# Patient Record
Sex: Male | Born: 1995 | Race: Black or African American | Hispanic: No | Marital: Single | Smoking: Never smoker
Health system: Southern US, Community
[De-identification: ages and names within clinical notes are randomized; demographics above are authoritative.]

## PROBLEM LIST (undated history)

## (undated) DIAGNOSIS — I10 Essential (primary) hypertension: Secondary | ICD-10-CM

## (undated) HISTORY — PX: TONSILLECTOMY: SUR1361

---

## 2021-01-20 ENCOUNTER — Emergency Department (HOSPITAL_COMMUNITY)
Admission: EM | Admit: 2021-01-20 | Discharge: 2021-01-20 | Disposition: A | Discharge status: Home / Self Care | Payer: Self-pay | Attending: Emergency Medicine | Admitting: Emergency Medicine

## 2021-01-20 ENCOUNTER — Encounter (HOSPITAL_COMMUNITY): Payer: Self-pay

## 2021-01-20 ENCOUNTER — Other Ambulatory Visit: Payer: Self-pay

## 2021-01-20 DIAGNOSIS — L910 Hypertrophic scar: Secondary | ICD-10-CM | POA: Insufficient documentation

## 2021-01-20 DIAGNOSIS — L089 Local infection of the skin and subcutaneous tissue, unspecified: Secondary | ICD-10-CM | POA: Insufficient documentation

## 2021-01-20 MED ORDER — DOXYCYCLINE HYCLATE 100 MG PO CAPS: 100.0000 mg | ORAL_CAPSULE | Freq: Two times a day (BID) | ORAL | 0 refills | Status: DC

## 2021-01-20 NOTE — ED Notes (Signed)
An After Visit Summary was printed and given to the patient. Discharge instructions given and no further questions at this time.  

## 2021-01-20 NOTE — Discharge Instructions (Addendum)
Call to set up an appointment with general surgery. In the mean time, take doyxcycline twice daily x 10 days for the infection. If you experience fever or if it worsens please return to the ED

## 2021-01-20 NOTE — ED Triage Notes (Signed)
Pt arrived via POV, states area on chest he was told was a keloid started draining this morning. Denies any fevers/chills. States this has been present for a year, has not had issues since.

## 2021-01-20 NOTE — ED Provider Notes (Signed)
  [facility]-EMERGENCY DEPT Provider Note   CSN: [id number] Arrival date & time: 01/20/21  1053     History No chief complaint on file.   Dmari Schubring is a 25 y.o. male.  HPI   Patient presents with keloid draining. Has had keloid middle of chest for one year, but this morning he noticed pus/bloody drainage. It is painful to touch. Doesn't remember hitting it or breaking it. No fevers,  Nausea, diarrhea chills.   History reviewed. No pertinent past medical history.  There are no problems to display for this patient.   Past Surgical History:  Procedure Laterality Date  . TONSILLECTOMY         History reviewed. No pertinent family history.  Social History   Tobacco Use  . Smoking status: Never Smoker  . Smokeless tobacco: Never Used    Home Medications Prior to Admission medications   Not on File    Allergies    Penicillins  Review of Systems   Review of Systems  Constitutional: Negative for chills and fever.  Skin:       Keloid draining    Physical Exam Updated Vital Signs BP (!) 176/96 (BP Location: Right Arm)   Pulse 92   Temp 98.6 F (37 C) (Oral)   Resp 18   SpO2 100%   Physical Exam Vitals and nursing note reviewed. Exam conducted with a chaperone present.  Constitutional:      General: He is not in acute distress.    Appearance: Normal appearance.  HENT:     Head: Normocephalic and atraumatic.  Eyes:     General: No scleral icterus.    Extraocular Movements: Extraocular movements intact.     Pupils: Pupils are equal, round, and reactive to light.  Skin:    Coloration: Skin is not jaundiced.     Comments: See picture. Mild pussy fluids expressed with pressure. No surrounding erythema.   Neurological:     Mental Status: He is alert. Mental status is at baseline.     Coordination: Coordination normal.       ED Results / Procedures / Treatments   Labs (all labs ordered are listed, but only abnormal results  are displayed) Labs Reviewed - No data to display  EKG None  Radiology No results found.  Procedures Procedures   Medications Ordered in ED Medications - No data to display  ED Course  I have reviewed the triage vital signs and the nursing notes.  Pertinent labs & imaging results that were available during my care of the patient were reviewed by me and considered in my medical decision making (see chart for details).    MDM Rules/Calculators/A&P                          Patient presents with keloid. Vitals stable, no systemic symptoms. Doubt cellulitis. It appears there is an infection possible underneath the keloid. Will start on doxycycline and give referral to general surgery.   Discussed HPI, physical exam and plan of care for this patient with attending Rolan Bucco. The attending physician evaluated this patient as part of a shared visit and agrees with plan of care.  Final Clinical Impression(s) / ED Diagnoses Final diagnoses:  None    Rx / DC Orders ED Discharge Orders    None       Theron Arista, PA-C 01/20/21 1258    Rolan Bucco, MD 01/20/21 1315

## 2021-08-22 ENCOUNTER — Telehealth: Payer: Medicaid Other | Admitting: Emergency Medicine

## 2021-08-22 NOTE — Progress Notes (Signed)
The patient no-showed for appointment despite this provider sending direct link, reaching out via phone with no response and waiting for at least 10 minutes from appointment time for patient to join. They will be marked as a NS for this appointment/time.   Jair Lindblad, PA-C    

## 2021-08-23 ENCOUNTER — Emergency Department (HOSPITAL_COMMUNITY)
Admission: EM | Admit: 2021-08-23 | Discharge: 2021-08-23 | Disposition: A | Discharge status: Home / Self Care | Payer: Managed Care, Other (non HMO) | Attending: Emergency Medicine | Admitting: Emergency Medicine

## 2021-08-23 ENCOUNTER — Other Ambulatory Visit: Payer: Self-pay

## 2021-08-23 ENCOUNTER — Encounter (HOSPITAL_COMMUNITY): Payer: Self-pay

## 2021-08-23 DIAGNOSIS — Z20822 Contact with and (suspected) exposure to covid-19: Secondary | ICD-10-CM | POA: Insufficient documentation

## 2021-08-23 DIAGNOSIS — R519 Headache, unspecified: Secondary | ICD-10-CM

## 2021-08-23 DIAGNOSIS — G43909 Migraine, unspecified, not intractable, without status migrainosus: Secondary | ICD-10-CM | POA: Diagnosis not present

## 2021-08-23 DIAGNOSIS — M791 Myalgia, unspecified site: Secondary | ICD-10-CM | POA: Diagnosis not present

## 2021-08-23 LAB — RESP PANEL BY RT-PCR (FLU A&B, COVID) ARPGX2
Influenza A by PCR: NEGATIVE
Influenza B by PCR: NEGATIVE
SARS Coronavirus 2 by RT PCR: NEGATIVE

## 2021-08-23 MED ORDER — KETOROLAC TROMETHAMINE 30 MG/ML IJ SOLN
30.0000 mg | Freq: Once | INTRAMUSCULAR | Status: AC
  Administered 2021-08-23: 30 mg via INTRAVENOUS
  Filled 2021-08-23: qty 1

## 2021-08-23 MED ORDER — DIPHENHYDRAMINE HCL 50 MG/ML IJ SOLN
50.0000 mg | Freq: Once | INTRAMUSCULAR | Status: AC
  Administered 2021-08-23: 50 mg via INTRAVENOUS
  Filled 2021-08-23: qty 1

## 2021-08-23 MED ORDER — PROCHLORPERAZINE MALEATE 10 MG PO TABS: 10.0000 mg | ORAL_TABLET | Freq: Two times a day (BID) | ORAL | 0 refills | Status: AC | PRN

## 2021-08-23 MED ORDER — PROCHLORPERAZINE EDISYLATE 10 MG/2ML IJ SOLN
10.0000 mg | Freq: Once | INTRAMUSCULAR | Status: AC
  Administered 2021-08-23: 10 mg via INTRAVENOUS
  Filled 2021-08-23: qty 2

## 2021-08-23 MED ORDER — SODIUM CHLORIDE 0.9 % IV BOLUS
1000.0000 mL | Freq: Once | INTRAVENOUS | Status: AC
  Administered 2021-08-23: 1000 mL via INTRAVENOUS

## 2021-08-23 NOTE — ED Triage Notes (Signed)
Pt reports head pain and mild nausea x1 week. Pt reports hx of migraines. Denies confusion and blurred vision. Denies injury to head.

## 2021-08-23 NOTE — Discharge Instructions (Signed)
Your history, exam, work-up today are consistent with evolution your chronic migraine type headaches.  Your neuro exam was completely reassuring and your headache resolved after the headache cocktail.  Please rest and stay hydrated and use the Compazine to help with any further nausea or headache.  We discussed using Benadryl to help if you get agitated.  Please follow-up with your primary doctor as well as follow-up with neurology for further headache planning.  Your COVID and flu test were negative.  Please rest.

## 2021-08-23 NOTE — ED Provider Notes (Signed)
[facility] DEPT Provider Note   CSN: [id number] Arrival date & time: 08/23/21  0844     History  Chief Complaint  Patient presents with   Migraine    Buddie Sulcer is a 26 y.o. male.  The history is provided by the patient and medical records. No language interpreter was used.  Migraine This is a recurrent problem. The current episode started more than 2 days ago. The problem occurs constantly. The problem has not changed since onset.Associated symptoms include headaches. Pertinent negatives include no chest pain, no abdominal pain and no shortness of breath. Nothing (bright light and loud noises) aggravates the symptoms. Nothing relieves the symptoms. Treatments tried: excedrin. The treatment provided no relief.      Home Medications Prior to Admission medications   Not on File      Allergies    Penicillins    Review of Systems   Review of Systems  Constitutional:  Negative for chills, diaphoresis, fatigue and fever.  HENT:  Negative for congestion.   Eyes:  Positive for photophobia. Negative for visual disturbance.  Respiratory:  Negative for cough, chest tightness, shortness of breath and wheezing.   Cardiovascular:  Negative for chest pain.  Gastrointestinal:  Positive for nausea. Negative for abdominal pain, constipation, diarrhea and vomiting.  Genitourinary:  Negative for flank pain.  Musculoskeletal:  Positive for myalgias. Negative for back pain, neck pain and neck stiffness.  Skin:  Negative for rash and wound.  Neurological:  Positive for headaches. Negative for seizures, syncope, speech difficulty, weakness, light-headedness and numbness.  Psychiatric/Behavioral:  Negative for agitation and confusion.   All other systems reviewed and are negative.  Physical Exam Updated Vital Signs BP 140/81    Pulse 69    Temp 98.7 F (37.1 C) (Oral)    Resp 16    SpO2 98%  Physical Exam Vitals and nursing note reviewed.  Constitutional:       General: He is not in acute distress.    Appearance: He is well-developed. He is not ill-appearing, toxic-appearing or diaphoretic.  HENT:     Head: Normocephalic and atraumatic.     Nose: Nose normal. No congestion or rhinorrhea.     Mouth/Throat:     Mouth: Mucous membranes are moist.     Pharynx: No oropharyngeal exudate or posterior oropharyngeal erythema.  Eyes:     General: No scleral icterus.    Extraocular Movements: Extraocular movements intact.     Conjunctiva/sclera: Conjunctivae normal.     Pupils: Pupils are equal, round, and reactive to light.  Neck:     Vascular: No carotid bruit.  Cardiovascular:     Rate and Rhythm: Normal rate and regular rhythm.     Pulses: Normal pulses.     Heart sounds: No murmur heard. Pulmonary:     Effort: Pulmonary effort is normal. No respiratory distress.     Breath sounds: Normal breath sounds.  Abdominal:     Palpations: Abdomen is soft.     Tenderness: There is no abdominal tenderness. There is no guarding or rebound.  Musculoskeletal:        General: No swelling or tenderness.     Cervical back: Neck supple. No rigidity or tenderness.     Right lower leg: No edema.     Left lower leg: No edema.  Skin:    General: Skin is warm and dry.     Capillary Refill: Capillary refill takes less than 2 seconds.  Findings: No erythema or rash.  Neurological:     General: No focal deficit present.     Mental Status: He is alert and oriented to person, place, and time. Mental status is at baseline.     Cranial Nerves: No cranial nerve deficit.     Sensory: No sensory deficit.     Motor: No weakness.     Coordination: Coordination normal.  Psychiatric:        Mood and Affect: Mood normal.    ED Results / Procedures / Treatments   Labs (all labs ordered are listed, but only abnormal results are displayed) Labs Reviewed  RESP PANEL BY RT-PCR (FLU A&B, COVID) ARPGX2    EKG None  Radiology No results  found.  Procedures Procedures    Medications Ordered in ED Medications  sodium chloride 0.9 % bolus 1,000 mL (has no administration in time range)  prochlorperazine (COMPAZINE) injection 10 mg (has no administration in time range)  diphenhydrAMINE (BENADRYL) injection 50 mg (has no administration in time range)  ketorolac (TORADOL) 30 MG/ML injection 30 mg (has no administration in time range)    ED Course/ Medical Decision Making/ A&P                           Medical Decision Making  Tyreke Bassinger is a 26 y.o. male with a chronic past medical history significant for migraines who presents with headache and nausea for 1 week.  According to patient, he had a headache that started about a week ago and although improved slightly it has been persistent.  He describes some photophobia and phonophobia.  He denies any fevers, chills, congestion, or cough.  Denies any sick contacts.  Denies any constipation, diarrhea, urinary changes, and does not report any incontinence.  Denies any changes in his arms or legs with no numbness, weakness, or neurologic complaints.  He reports photophobia but does not report any vision changes.  No reported head injury.  He reports he gets headaches frequently usually every few weeks.  On exam, lungs are clear and chest is nontender.  Abdomen is nontender.  No focal neurologic deficits with normal sensation and strength in extremities, normal finger-nose-finger testing, and pupils are symmetric and reactive with normal extraocular movements.  Symmetric smile and clear speech.  Neck nontender, back nontender.  Rest of exam unremarkable.  Had a shared decision made conversation with patient and based on his reassuring exam, I suspect this is a recurrence of a migrainous type headache that has evolved slightly.  Patient agrees for headache cocktail and reassessment.  Due to his report of some headache and also some mild myalgias diffusely, will add on a flu/COVID swab  given the ongoing pandemic and amount in the community.  Based on his lack of neck pain, fevers, low suspicion for a meningitic type picture and we agree to hold on lumbar puncture.  Based on his reassuring neuro exam, I agree with patient to hold on any imaging of the head at this time.  Anticipate reassessment after headache cocktail.  12:26 PM Patient reports headache has resolved.  He is feeling much better.  He would like to go home and rest.  We still agree on the previous plan holding on further work-up.  Patient will be given prescription for Compazine which we discussed allergies Benadryl if needed.  Patient will follow with PCP and also given number for neurology for headache follow-up.  Patient is return precautions and  follow instructions was discharged in good condition.  COVID and flu were negative as well.        Final Clinical Impression(s) / ED Diagnoses Final diagnoses:  Bad headache  Myalgia    Rx / DC Orders ED Discharge Orders          Ordered    prochlorperazine (COMPAZINE) 10 MG tablet  2 times daily PRN        08/23/21 1227            Clinical Impression: 1. Bad headache   2. Myalgia     Disposition: Discharge  Condition: Good  I have discussed the results, Dx and Tx plan with the pt(& family if present). He/she/they expressed understanding and agree(s) with the plan. Discharge instructions discussed at great length. Strict return precautions discussed and pt &/or family have verbalized understanding of the instructions. No further questions at time of discharge.    New Prescriptions   PROCHLORPERAZINE (COMPAZINE) 10 MG TABLET    Take 1 tablet (10 mg total) by mouth 2 (two) times daily as needed for nausea or vomiting.    Follow Up: [facility] [address] [place] [phone]       Jahniya Duzan, Gwenyth Allegra, MD 08/23/21 1228

## 2021-12-20 ENCOUNTER — Emergency Department (HOSPITAL_BASED_OUTPATIENT_CLINIC_OR_DEPARTMENT_OTHER): Payer: Managed Care, Other (non HMO)

## 2021-12-20 ENCOUNTER — Emergency Department (HOSPITAL_BASED_OUTPATIENT_CLINIC_OR_DEPARTMENT_OTHER)
Admission: EM | Admit: 2021-12-20 | Discharge: 2021-12-20 | Discharge status: Against Medical Advice | Payer: Managed Care, Other (non HMO) | Attending: Emergency Medicine | Admitting: Emergency Medicine

## 2021-12-20 ENCOUNTER — Other Ambulatory Visit: Payer: Self-pay

## 2021-12-20 ENCOUNTER — Encounter (HOSPITAL_BASED_OUTPATIENT_CLINIC_OR_DEPARTMENT_OTHER): Payer: Self-pay

## 2021-12-20 DIAGNOSIS — Z5321 Procedure and treatment not carried out due to patient leaving prior to being seen by health care provider: Secondary | ICD-10-CM | POA: Insufficient documentation

## 2021-12-20 DIAGNOSIS — M79605 Pain in left leg: Secondary | ICD-10-CM | POA: Insufficient documentation

## 2021-12-20 DIAGNOSIS — M79651 Pain in right thigh: Secondary | ICD-10-CM | POA: Diagnosis not present

## 2021-12-20 DIAGNOSIS — M25561 Pain in right knee: Secondary | ICD-10-CM | POA: Diagnosis not present

## 2021-12-20 HISTORY — DX: Essential (primary) hypertension: I10

## 2021-12-20 NOTE — ED Triage Notes (Addendum)
Pt reports sitting while at work and left leg began feeling tight and pain began. Points to pain right mid lateral thigh, knee and just below knee  Pain with movement and when ambulating.  ?

## 2021-12-27 ENCOUNTER — Emergency Department (HOSPITAL_BASED_OUTPATIENT_CLINIC_OR_DEPARTMENT_OTHER): Payer: Managed Care, Other (non HMO)

## 2021-12-27 ENCOUNTER — Encounter (HOSPITAL_BASED_OUTPATIENT_CLINIC_OR_DEPARTMENT_OTHER): Payer: Self-pay

## 2021-12-27 ENCOUNTER — Other Ambulatory Visit: Payer: Self-pay

## 2021-12-27 ENCOUNTER — Emergency Department (HOSPITAL_BASED_OUTPATIENT_CLINIC_OR_DEPARTMENT_OTHER)
Admission: EM | Admit: 2021-12-27 | Discharge: 2021-12-27 | Disposition: A | Discharge status: Home / Self Care | Payer: Managed Care, Other (non HMO) | Attending: Emergency Medicine | Admitting: Emergency Medicine

## 2021-12-27 DIAGNOSIS — I10 Essential (primary) hypertension: Secondary | ICD-10-CM | POA: Insufficient documentation

## 2021-12-27 DIAGNOSIS — R202 Paresthesia of skin: Secondary | ICD-10-CM

## 2021-12-27 DIAGNOSIS — R519 Headache, unspecified: Secondary | ICD-10-CM | POA: Diagnosis present

## 2021-12-27 LAB — COMPREHENSIVE METABOLIC PANEL
ALT: 22 U/L (ref 0–44)
AST: 15 U/L (ref 15–41)
Albumin: 4 g/dL (ref 3.5–5.0)
Alkaline Phosphatase: 75 U/L (ref 38–126)
Anion gap: 7 (ref 5–15)
BUN: 9 mg/dL (ref 6–20)
CO2: 25 mmol/L (ref 22–32)
Calcium: 8.8 mg/dL — ABNORMAL LOW (ref 8.9–10.3)
Chloride: 103 mmol/L (ref 98–111)
Creatinine, Ser: 0.87 mg/dL (ref 0.61–1.24)
GFR, Estimated: >60 mL/min (ref >60)
Glucose, Bld: 101 mg/dL — ABNORMAL HIGH (ref 70–99)
Potassium: 3.8 mmol/L (ref 3.5–5.1)
Sodium: 135 mmol/L (ref 135–145)
Total Bilirubin: 0.4 mg/dL (ref 0.3–1.2)
Total Protein: 7.9 g/dL (ref 6.5–8.1)

## 2021-12-27 LAB — CBC WITH DIFFERENTIAL/PLATELET
Abs Immature Granulocytes: 0.02 10*3/uL (ref 0.00–0.07)
Basophils Absolute: 0 10*3/uL (ref 0.0–0.1)
Basophils Relative: 0 %
Eosinophils Absolute: 0 10*3/uL (ref 0.0–0.5)
Eosinophils Relative: 0 %
HCT: 42.1 % (ref 39.0–52.0)
Hemoglobin: 13.7 g/dL (ref 13.0–17.0)
Immature Granulocytes: 0 %
Lymphocytes Relative: 27 %
Lymphs Abs: 2.7 10*3/uL (ref 0.7–4.0)
MCH: 27.3 pg (ref 26.0–34.0)
MCHC: 32.5 g/dL (ref 30.0–36.0)
MCV: 84 fL (ref 80.0–100.0)
Monocytes Absolute: 0.6 10*3/uL (ref 0.1–1.0)
Monocytes Relative: 6 %
Neutro Abs: 6.5 10*3/uL (ref 1.7–7.7)
Neutrophils Relative %: 67 %
Platelets: 346 10*3/uL (ref 150–400)
RBC: 5.01 MIL/uL (ref 4.22–5.81)
RDW: 13.4 % (ref 11.5–15.5)
WBC: 9.8 10*3/uL (ref 4.0–10.5)
nRBC: 0 % (ref 0.0–0.2)

## 2021-12-27 NOTE — ED Provider Notes (Addendum)
?[facility] EMERGENCY DEPARTMENT ?Provider Note ? ? ?CSN: [id number] ?Arrival date & time: 12/27/21  2025 ? ?  ? ?History ? ?Chief Complaint  ?Patient presents with  ? Headache  ? Tingling  ? ? ?Paul Jensen is a 26 y.o. male. ? ?Patient with complaint of tingling to bilateral feet and hands.  Ongoing for 1 week.  Denies any weakness any difficulty with speech blurred vision any true numbness.  Just tingling.  Has a history of hypertension is taken amlodipine 5 mg for that.  Patient seen with same complaint April 15 at [facility] emergency department.  And stated at that time that the symptoms have been related to low potassium in the past.  Medical records show in the past that he had a potassium of 2.4 but on April 15 of potassium was 4.1.  Patient states he has not had a CT of the head for this.  This has occurred in the past.  Most recently April 15 and the time prior to that.  Patient is not a smoker.  Past medical history just significant for hypertension.  In addition patient denies any headache.  Patient states she just feels strange in the head. ? ? ?  ? ?Home Medications ?Prior to Admission medications   ?Medication Sig Start Date End Date Taking? Authorizing Provider  ?prochlorperazine (COMPAZINE) 10 MG tablet Take 1 tablet (10 mg total) by mouth 2 (two) times daily as needed for nausea or vomiting. 08/23/21   Tegeler, Canary Brim, MD  ?   ? ?Allergies    ?Penicillins   ? ?Review of Systems   ?Review of Systems  ?Constitutional:  Negative for chills and fever.  ?HENT:  Negative for ear pain and sore throat.   ?Eyes:  Negative for pain and visual disturbance.  ?Respiratory:  Negative for cough and shortness of breath.   ?Cardiovascular:  Negative for chest pain and palpitations.  ?Gastrointestinal:  Negative for abdominal pain and vomiting.  ?Genitourinary:  Negative for dysuria and hematuria.  ?Musculoskeletal:  Negative for arthralgias and back pain.  ?Skin:  Negative for color change  and rash.  ?Neurological:  Positive for light-headedness. Negative for dizziness, seizures, syncope, facial asymmetry, speech difficulty, weakness, numbness and headaches.  ?Hematological:  Does not bruise/bleed easily.  ?All other systems reviewed and are negative. ? ?Physical Exam ?Updated Vital Signs ?BP (!) 157/100 (BP Location: Right Arm)   Pulse 95   Temp 98.2 ?F (36.8 ?C) (Oral)   Resp 18   Ht 1.753 m (5\' 9" )   Wt 124.7 kg   SpO2 100%   BMI 40.61 kg/m?  ?Physical Exam ?Vitals and nursing note reviewed.  ?Constitutional:   ?   General: He is not in acute distress. ?   Appearance: He is well-developed. He is not ill-appearing, toxic-appearing or diaphoretic.  ?HENT:  ?   Head: Normocephalic and atraumatic.  ?Eyes:  ?   General: No visual field deficit. ?   Extraocular Movements: Extraocular movements intact.  ?   Conjunctiva/sclera: Conjunctivae normal.  ?   Pupils: Pupils are equal, round, and reactive to light.  ?Cardiovascular:  ?   Rate and Rhythm: Normal rate and regular rhythm.  ?   Heart sounds: No murmur heard. ?Pulmonary:  ?   Effort: Pulmonary effort is normal. No respiratory distress.  ?   Breath sounds: Normal breath sounds.  ?Abdominal:  ?   Palpations: Abdomen is soft.  ?   Tenderness: There is no abdominal tenderness.  ?  Musculoskeletal:     ?   General: No swelling. Normal range of motion.  ?   Cervical back: Normal range of motion and neck supple.  ?Skin: ?   General: Skin is warm and dry.  ?   Capillary Refill: Capillary refill takes less than 2 seconds.  ?Neurological:  ?   Mental Status: He is alert and oriented to person, place, and time.  ?   GCS: GCS eye subscore is 4. GCS verbal subscore is 5. GCS motor subscore is 6.  ?   Cranial Nerves: No cranial nerve deficit, dysarthria or facial asymmetry.  ?   Sensory: No sensory deficit.  ?   Motor: No weakness.  ?   Coordination: Coordination normal.  ?   Deep Tendon Reflexes: Reflexes normal.  ?Psychiatric:     ?   Mood and Affect: Mood  normal.     ?   Speech: Speech normal.     ?   Behavior: Behavior normal.  ? ? ?ED Results / Procedures / Treatments   ?Labs ?(all labs ordered are listed, but only abnormal results are displayed) ?Labs Reviewed  ?CBC WITH DIFFERENTIAL/PLATELET  ?COMPREHENSIVE METABOLIC PANEL  ? ? ?EKG ?None ? ?Radiology ?No results found. ? ?Procedures ?Procedures  ? ? ?Medications Ordered in ED ?Medications - No data to display ? ?ED Course/ Medical Decision Making/ A&P ?  ?                        ?Medical Decision Making ?Amount and/or Complexity of Data Reviewed ?Labs: ordered. ?Radiology: ordered. ? ? ?Patient is neuro exam without any deficits.  Suspect this is similar to his previous presentations with certainly will make sure he does not have hypokalemia.  Based on record review patient has not had CT head.  So we will get that the main concern would be possibly MS.  Certainly patient's symptoms have been present for a week would make it likely that any kind of deficit from a CVA which I think is unlikely would show up.  Do not feel that patient needs MRI.  Can have outpatient follow-up with neurology. ? ?We will get basic labs and the CT head.  Patient's blood pressure upon arrival was 157/100 but repeat was 143/98.  So is showing some signs of some elevated blood pressure states he is taking his amlodipine 5 mg as directed.  And he does have a primary care doctor. ? ?Head CT without any acute findings.  CBC normal.  Complete metabolic panel normal other than glucose 101.  Liver function tests normal.  Potassium normal at 3.8. ? ?Patient will need close follow-up for blood pressure.  And also will refer to neurology. ? ?Final Clinical Impression(s) / ED Diagnoses ?Final diagnoses:  ?Primary hypertension  ?Paresthesia of both hands  ?Tingling of both feet  ? ? ?Rx / DC Orders ?ED Discharge Orders   ? ? None  ? ?  ? ? ?  ?Vanetta Mulders, MD ?12/27/21 [phone] ? ?  ?Vanetta Mulders, MD ?12/27/21 1031 ? ?

## 2021-12-27 NOTE — ED Triage Notes (Signed)
Pt C/o of headache and bilateral tingling in arms and legs x 1week. Denies numbness, or blurred vision. Hx of HTN, states did not take his BP meds this morning.  ?

## 2021-12-27 NOTE — Discharge Instructions (Addendum)
Follow-up with your primary care doctor for blood pressure rechecks.  Follow-up with neurology make an appointment for the tingling in the hands and arms.  Today's work-up without any acute findings.  Potassium was 3.8.  Work note provided.  ? ?Continue your blood pressure medicine. ?

## 2022-07-13 ENCOUNTER — Encounter (HOSPITAL_BASED_OUTPATIENT_CLINIC_OR_DEPARTMENT_OTHER): Payer: Self-pay | Admitting: Emergency Medicine

## 2022-07-13 ENCOUNTER — Emergency Department (HOSPITAL_BASED_OUTPATIENT_CLINIC_OR_DEPARTMENT_OTHER)
Admission: EM | Admit: 2022-07-13 | Discharge: 2022-07-13 | Disposition: A | Discharge status: Home / Self Care | Payer: BC Managed Care – PPO | Attending: Emergency Medicine | Admitting: Emergency Medicine

## 2022-07-13 ENCOUNTER — Emergency Department (HOSPITAL_BASED_OUTPATIENT_CLINIC_OR_DEPARTMENT_OTHER): Payer: BC Managed Care – PPO

## 2022-07-13 ENCOUNTER — Other Ambulatory Visit: Payer: Self-pay

## 2022-07-13 DIAGNOSIS — R0789 Other chest pain: Secondary | ICD-10-CM | POA: Insufficient documentation

## 2022-07-13 DIAGNOSIS — I10 Essential (primary) hypertension: Secondary | ICD-10-CM | POA: Insufficient documentation

## 2022-07-13 DIAGNOSIS — R079 Chest pain, unspecified: Secondary | ICD-10-CM

## 2022-07-13 NOTE — Discharge Instructions (Signed)
Your chest x-ray did not show pneumonia or collapsed lung.  I think based on your story its unlikely to be a heart attack.  Your EKG did not show any obvious concerning findings.  Please follow-up with your family doctor in the office.  Return to the emergency department for worsening symptoms especially upon exertion.  Take 4 over the counter ibuprofen tablets 3 times a day or 2 over-the-counter naproxen tablets twice a day for pain. Also take tylenol 1000mg (2 extra strength) four times a day.    Try pepcid or tagamet up to twice a day.  Try to avoid things that may make this worse, most commonly these are spicy foods tomato based products fatty foods chocolate and peppermint.  Alcohol and tobacco can also make this worse.  Return to the emergency department for sudden worsening pain fever or inability to eat or drink.

## 2022-07-13 NOTE — ED Triage Notes (Signed)
Left breast pain since the weekend.  No sob.  No travel.  No cold symptoms.  No injury.  Deep breath worsens pain.

## 2022-07-13 NOTE — ED Provider Notes (Signed)
[facility] EMERGENCY DEPARTMENT Provider Note   CSN: [id number] Arrival date & time: 07/13/22  7510     History  Chief Complaint  Patient presents with   Chest Pain    Leonard Feigel is a 26 y.o. male.  26 yo M with a chief complaints of left-sided chest discomfort.  Is been going on for a few days now.  Worse with deep breathing and certain positions.  Not exertional no difficulty breathing some congestion but no cough.  No fevers.  No trauma.  Patient denies history of MI, denies hyperlipidemia diabetes or smoking.  Has a hx of HTN, sister with MI in her 33's.   Patient denies history of PE or DVT denies hemoptysis denies unilateral lower extremity edema denies recent surgery immobilization hospitalization estrogen use or history of cancer.     Chest Pain      Home Medications Prior to Admission medications   Medication Sig Start Date End Date Taking? Authorizing Provider  prochlorperazine (COMPAZINE) 10 MG tablet Take 1 tablet (10 mg total) by mouth 2 (two) times daily as needed for nausea or vomiting. 08/23/21   Tegeler, Canary Brim, MD      Allergies    Penicillins    Review of Systems   Review of Systems  Cardiovascular:  Positive for chest pain.    Physical Exam Updated Vital Signs BP 119/82   Pulse 65   Temp 98.5 F (36.9 C) (Oral)   Resp 17   Ht 5\' 9"  (1.753 m)   Wt 122.5 kg   SpO2 98%   BMI 39.87 kg/m  Physical Exam Vitals and nursing note reviewed.  Constitutional:      Appearance: He is well-developed.  HENT:     Head: Normocephalic and atraumatic.  Eyes:     Pupils: Pupils are equal, round, and reactive to light.  Neck:     Vascular: No JVD.  Cardiovascular:     Rate and Rhythm: Normal rate and regular rhythm.     Heart sounds: No murmur heard.    No friction rub. No gallop.  Pulmonary:     Effort: No respiratory distress.     Breath sounds: No wheezing.  Chest:     Chest wall: Tenderness present.     Comments: Pain  with palpation about the medial clavicular line about ribs 6 and 7 reproduces discomfort.  No rash. Abdominal:     General: There is no distension.     Tenderness: There is no abdominal tenderness. There is no guarding or rebound.  Musculoskeletal:        General: Normal range of motion.     Cervical back: Normal range of motion and neck supple.  Skin:    Coloration: Skin is not pale.     Findings: No rash.  Neurological:     Mental Status: He is alert and oriented to person, place, and time.  Psychiatric:        Behavior: Behavior normal.     ED Results / Procedures / Treatments   Labs (all labs ordered are listed, but only abnormal results are displayed) Labs Reviewed - No data to display  EKG EKG Interpretation  Date/Time:  Wednesday July 13 2022 08:21:27 EST Ventricular Rate:  74 PR Interval:  144 QRS Duration: 85 QT Interval:  345 QTC Calculation: 383 R Axis:   48 Text Interpretation: Sinus rhythm Borderline T wave abnormalities No old tracing to compare Confirmed by [id number] [phone]) on 07/13/2022 8:40:27 AM  Radiology DG Chest Port 1 View  Result Date: 07/13/2022 CLINICAL DATA:  Chest pain. EXAM: PORTABLE CHEST 1 VIEW COMPARISON:  October 16, 2021. FINDINGS: The heart size and mediastinal contours are within normal limits. Both lungs are clear. The visualized skeletal structures are unremarkable. IMPRESSION: No active disease. Electronically Signed   By: Lupita Raider M.D.   On: 07/13/2022 08:51    Procedures Procedures    Medications Ordered in ED Medications - No data to display  ED Course/ Medical Decision Making/ A&P                           Medical Decision Making Amount and/or Complexity of Data Reviewed Radiology: ordered.   26 yo M with a chief complaints of left-sided chest discomfort.  This is atypical in nature and reproduced on exam.  Has been going on for a few days now.  EKG without obvious acute ischemia.  Will obtain a chest  x-ray.  Treat as musculoskeletal.  PCP follow-up.  Chest x-ray independently interpreted by me negative for focal treated and pneumothorax.  10:41 AM:  I have discussed the diagnosis/risks/treatment options with the patient.  Evaluation and diagnostic testing in the emergency department does not suggest an emergent condition requiring admission or immediate intervention beyond what has been performed at this time.  They will follow up with PCP. We also discussed returning to the ED immediately if new or worsening sx occur. We discussed the sx which are most concerning (e.g., sudden worsening pain, fever, inability to tolerate by mouth, exertional symptoms) that necessitate immediate return. Medications administered to the patient during their visit and any new prescriptions provided to the patient are listed below.  Medications given during this visit Medications - No data to display   The patient appears reasonably screen and/or stabilized for discharge and I doubt any other medical condition or other [facility] requiring further screening, evaluation, or treatment in the ED at this time prior to discharge.          Final Clinical Impression(s) / ED Diagnoses Final diagnoses:  Nonspecific chest pain    Rx / DC Orders ED Discharge Orders     None         Melene Plan, DO 07/13/22 1041

## 2022-07-13 NOTE — ED Notes (Signed)
Discharge instructions reviewed with patient. Patient verbalizes understanding, no further questions at this time. Medications and follow up information provided. No acute distress noted at time of departure.  

## 2022-09-27 ENCOUNTER — Emergency Department (HOSPITAL_BASED_OUTPATIENT_CLINIC_OR_DEPARTMENT_OTHER)
Admission: EM | Admit: 2022-09-27 | Discharge: 2022-09-27 | Disposition: A | Discharge status: Home / Self Care | Payer: BC Managed Care – PPO | Attending: Emergency Medicine | Admitting: Emergency Medicine

## 2022-09-27 ENCOUNTER — Other Ambulatory Visit: Payer: Self-pay

## 2022-09-27 ENCOUNTER — Encounter (HOSPITAL_BASED_OUTPATIENT_CLINIC_OR_DEPARTMENT_OTHER): Payer: Self-pay

## 2022-09-27 ENCOUNTER — Emergency Department (HOSPITAL_BASED_OUTPATIENT_CLINIC_OR_DEPARTMENT_OTHER): Payer: BC Managed Care – PPO

## 2022-09-27 DIAGNOSIS — S86912A Strain of unspecified muscle(s) and tendon(s) at lower leg level, left leg, initial encounter: Secondary | ICD-10-CM | POA: Insufficient documentation

## 2022-09-27 DIAGNOSIS — X58XXXA Exposure to other specified factors, initial encounter: Secondary | ICD-10-CM | POA: Diagnosis not present

## 2022-09-27 DIAGNOSIS — T148XXA Other injury of unspecified body region, initial encounter: Secondary | ICD-10-CM

## 2022-09-27 DIAGNOSIS — M25562 Pain in left knee: Secondary | ICD-10-CM | POA: Diagnosis present

## 2022-09-27 NOTE — ED Triage Notes (Signed)
Pt c/o pain behind L knee starting last night.  Denies injury.  Pain score 8/10.  Pt reports pain is worse w/ ambulation.

## 2022-09-27 NOTE — Discharge Instructions (Signed)
Use Tylenol or Ibuprofen for symptomatic relief.  Make an appointment to follow-up with your primary care doctor.  Your blood pressure was a little bit high.  This needs to be rechecked by your primary care doctor.  Return to the emergency room if you have any worsening symptoms.

## 2022-09-27 NOTE — ED Provider Notes (Signed)
[facility] Provider Note   CSN: [id number] Arrival date & time: 09/27/22  3810     History  Chief Complaint  Patient presents with   Knee Pain    Ellwyn Ergle is a 27 y.o. male.  Patient is a 27 year old who complains of pain to his left knee.  He said it started last night and feels like a muscle ache.  It is worse with movement or ambulation.  It is on the lateral aspect of his left upper calf.  He denies any known injury.  No numbness or weakness to the leg.  No chest pain or shortness of breath.  No swelling to the leg.       Home Medications Prior to Admission medications   Medication Sig Start Date End Date Taking? Authorizing Provider  prochlorperazine (COMPAZINE) 10 MG tablet Take 1 tablet (10 mg total) by mouth 2 (two) times daily as needed for nausea or vomiting. 08/23/21   Tegeler, Gwenyth Allegra, MD      Allergies    Penicillins    Review of Systems   Review of Systems  Constitutional:  Negative for chills, diaphoresis, fatigue and fever.  HENT:  Negative for congestion, rhinorrhea and sneezing.   Eyes: Negative.   Respiratory:  Negative for cough, chest tightness and shortness of breath.   Cardiovascular:  Negative for chest pain and leg swelling.  Gastrointestinal:  Negative for abdominal pain, blood in stool, diarrhea, nausea and vomiting.  Genitourinary:  Negative for difficulty urinating, flank pain, frequency and hematuria.  Musculoskeletal:  Positive for arthralgias and myalgias. Negative for back pain.  Skin:  Negative for rash.  Neurological:  Negative for dizziness, speech difficulty, weakness, numbness and headaches.    Physical Exam Updated Vital Signs BP (!) 146/80 (BP Location: Left Arm)   Pulse 99   Temp 98.1 F (36.7 C) (Oral)   Resp 20   Ht 5\' 9"  (1.753 m)   Wt 129.3 kg   SpO2 100%   BMI 42.09 kg/m  Physical Exam Constitutional:      Appearance: He is well-developed.  HENT:      Head: Normocephalic and atraumatic.  Cardiovascular:     Rate and Rhythm: Normal rate.  Pulmonary:     Effort: Pulmonary effort is normal.  Musculoskeletal:        General: Tenderness present.     Cervical back: Normal range of motion and neck supple.     Comments: Positive tenderness to the left upper cath at the base of the knee.  In the left side and posterior aspect of the calf.  There is no specific tenderness to the knee joint although there is some pain on varus stress test.  No gross ligament instability is noted.  Pedal pulses are intact.  He has normal sensation and motor function distally.  No visualized swelling is noted to the leg.  Skin:    General: Skin is warm and dry.  Neurological:     Mental Status: He is alert and oriented to person, place, and time.     ED Results / Procedures / Treatments   Labs (all labs ordered are listed, but only abnormal results are displayed) Labs Reviewed - No data to display  EKG None  Radiology US Venous Img Lower  Left (DVT Study)  Result Date: 09/27/2022 CLINICAL DATA:  Left posterior knee pain EXAM: LEFT LOWER EXTREMITY VENOUS DOPPLER ULTRASOUND TECHNIQUE: Gray-scale sonography with compression, as well as color  and duplex ultrasound, were performed to evaluate the deep venous system(s) from the level of the common femoral vein through the popliteal and proximal calf veins. COMPARISON:  None Available. FINDINGS: VENOUS Normal compressibility of the common femoral, superficial femoral, and popliteal veins, as well as the visualized calf veins. Visualized portions of profunda femoral vein and great saphenous vein unremarkable. No filling defects to suggest DVT on grayscale or color Doppler imaging. Doppler waveforms show normal direction of venous flow, normal respiratory plasticity and response to augmentation. Limited views of the contralateral common femoral vein are unremarkable. OTHER None. Limitations: none IMPRESSION: Negative.  Electronically Signed   By: Jacqulynn Cadet M.D.   On: 09/27/2022 11:00   DG Knee Complete 4 Views Left  Result Date: 09/27/2022 CLINICAL DATA:  Left leg pain.  No known injury. EXAM: LEFT KNEE - COMPLETE 4+ VIEW COMPARISON:  No recent prior. FINDINGS: Left knee joint effusion cannot be excluded. Patella is high-riding. No evidence of fracture or dislocation. IMPRESSION: 1. Left knee joint effusion cannot be excluded. Patella Alta cannot be excluded. No evidence of fracture or dislocation. Electronically Signed   By: Marcello Moores  Register M.D.   On: 09/27/2022 10:31    Procedures Procedures    Medications Ordered in ED Medications - No data to display  ED Course/ Medical Decision Making/ A&P                             Medical Decision Making Amount and/or Complexity of Data Reviewed Radiology: ordered and independent interpretation performed. Decision-making details documented in ED Course.  Risk OTC drugs.   Patient is a 27 year old who has pain to his left knee.  It actually seems to be more in the lateral muscle adjacent to the knee.  He is neurovascularly intact.  He had an ultrasound which was negative for DVT.  X-rays of the knee were interpreted by me and confirmed by the radiologist to show no acute abnormalities.  The radiologist questions a high riding patella but on exam he has got normal movement and appears to be functioning normally.  He does a straight leg raise without difficulty.  I do not appreciate any significant effusion on clinical exam.  No suggestions of infection.  He was discharged home in good condition.  Symptomatic care instructions were given.  He says that he will follow-up with his primary care doctor.  Return precautions were given.  Final Clinical Impression(s) / ED Diagnoses Final diagnoses:  Muscle strain    Rx / DC Orders ED Discharge Orders     None         Malvin Johns, MD 09/27/22 1145

## 2022-09-27 NOTE — ED Notes (Signed)
Discharge paperwork reviewed entirely with patient, including Rx's and follow up care. Pain was under control. Pt verbalized understanding as well as all parties involved. No questions or concerns voiced at the time of discharge. No acute distress noted.   Pt ambulated out to PVA without incident or assistance.

## 2023-02-10 ENCOUNTER — Emergency Department (HOSPITAL_BASED_OUTPATIENT_CLINIC_OR_DEPARTMENT_OTHER): Payer: BC Managed Care – PPO

## 2023-02-10 ENCOUNTER — Emergency Department (HOSPITAL_BASED_OUTPATIENT_CLINIC_OR_DEPARTMENT_OTHER)
Admission: EM | Admit: 2023-02-10 | Discharge: 2023-02-10 | Disposition: A | Discharge status: Home / Self Care | Payer: BC Managed Care – PPO | Attending: Emergency Medicine | Admitting: Emergency Medicine

## 2023-02-10 ENCOUNTER — Encounter (HOSPITAL_BASED_OUTPATIENT_CLINIC_OR_DEPARTMENT_OTHER): Payer: Self-pay | Admitting: Emergency Medicine

## 2023-02-10 ENCOUNTER — Other Ambulatory Visit: Payer: Self-pay

## 2023-02-10 DIAGNOSIS — R202 Paresthesia of skin: Secondary | ICD-10-CM | POA: Diagnosis not present

## 2023-02-10 DIAGNOSIS — R0789 Other chest pain: Secondary | ICD-10-CM | POA: Diagnosis not present

## 2023-02-10 DIAGNOSIS — I10 Essential (primary) hypertension: Secondary | ICD-10-CM | POA: Insufficient documentation

## 2023-02-10 LAB — CBC WITH DIFFERENTIAL/PLATELET
Abs Immature Granulocytes: 0.01 10*3/uL (ref 0.00–0.07)
Basophils Absolute: 0 10*3/uL (ref 0.0–0.1)
Basophils Relative: 0 %
Eosinophils Absolute: 0.1 10*3/uL (ref 0.0–0.5)
Eosinophils Relative: 1 %
HCT: 43.5 % (ref 39.0–52.0)
Hemoglobin: 14.2 g/dL (ref 13.0–17.0)
Immature Granulocytes: 0 %
Lymphocytes Relative: 28 %
Lymphs Abs: 2.5 10*3/uL (ref 0.7–4.0)
MCH: 28.1 pg (ref 26.0–34.0)
MCHC: 32.6 g/dL (ref 30.0–36.0)
MCV: 86.1 fL (ref 80.0–100.0)
Monocytes Absolute: 0.5 10*3/uL (ref 0.1–1.0)
Monocytes Relative: 6 %
Neutro Abs: 5.8 10*3/uL (ref 1.7–7.7)
Neutrophils Relative %: 65 %
Platelets: 286 10*3/uL (ref 150–400)
RBC: 5.05 MIL/uL (ref 4.22–5.81)
RDW: 12.8 % (ref 11.5–15.5)
WBC: 8.9 10*3/uL (ref 4.0–10.5)
nRBC: 0 % (ref 0.0–0.2)

## 2023-02-10 LAB — MAGNESIUM: Magnesium: 1.9 mg/dL (ref 1.7–2.4)

## 2023-02-10 LAB — RAPID URINE DRUG SCREEN, HOSP PERFORMED
Amphetamines: NOT DETECTED
Barbiturates: NOT DETECTED
Benzodiazepines: NOT DETECTED
Cocaine: NOT DETECTED
Opiates: NOT DETECTED
Tetrahydrocannabinol: NOT DETECTED

## 2023-02-10 LAB — COMPREHENSIVE METABOLIC PANEL
ALT: 20 U/L (ref 0–44)
AST: 15 U/L (ref 15–41)
Albumin: 3.9 g/dL (ref 3.5–5.0)
Alkaline Phosphatase: 70 U/L (ref 38–126)
Anion gap: 9 (ref 5–15)
BUN: 13 mg/dL (ref 6–20)
CO2: 24 mmol/L (ref 22–32)
Calcium: 8.8 mg/dL — ABNORMAL LOW (ref 8.9–10.3)
Chloride: 103 mmol/L (ref 98–111)
Creatinine, Ser: 0.82 mg/dL (ref 0.61–1.24)
GFR, Estimated: >60 mL/min (ref >60)
Glucose, Bld: 131 mg/dL — ABNORMAL HIGH (ref 70–99)
Potassium: 3.9 mmol/L (ref 3.5–5.1)
Sodium: 136 mmol/L (ref 135–145)
Total Bilirubin: 0.7 mg/dL (ref 0.3–1.2)
Total Protein: 7.6 g/dL (ref 6.5–8.1)

## 2023-02-10 LAB — TROPONIN I (HIGH SENSITIVITY)
Troponin I (High Sensitivity): 3 ng/L (ref <18)
Troponin I (High Sensitivity): 2 ng/L (ref <18)

## 2023-02-10 MED ORDER — ACETAMINOPHEN 325 MG PO TABS: 650.0000 mg | ORAL_TABLET | Freq: Four times a day (QID) | ORAL | 0 refills | Status: AC | PRN

## 2023-02-10 MED ORDER — IBUPROFEN 600 MG PO TABS: 600.0000 mg | ORAL_TABLET | Freq: Four times a day (QID) | ORAL | 0 refills | Status: AC | PRN

## 2023-02-10 MED ORDER — SODIUM CHLORIDE 0.9 % IV BOLUS
1000.0000 mL | Freq: Once | INTRAVENOUS | Status: AC
  Administered 2023-02-10: 1000 mL via INTRAVENOUS

## 2023-02-10 MED ORDER — CYCLOBENZAPRINE HCL 10 MG PO TABS: 10.0000 mg | ORAL_TABLET | Freq: Two times a day (BID) | ORAL | 0 refills | Status: AC | PRN

## 2023-02-10 NOTE — ED Provider Notes (Signed)
[place] [facility]GENCY DEPARTMENT AT [facility] Provider Note  CSN: [id number] Arrival date & time: 02/10/23 7829  Chief Complaint(s) Chest Pain  HPI Paul Jensen is a 27 y.o. male with past medical history as below, significant for hypertension, obesity who presents to the ED with complaint of cp/arm tingling. Similar symptoms in the past a/w low potassium per pt a/w medication. Onset symtpoms 2-3 days ago, tingling to his right foot at times and tingling in his arm/hand at times. A/w some chest discomfort, no dyspnea or cough, no fevers or chills. Tingling improves with ambulation. No numbness or weakness, no gait change, no vision or hearing changes, no headache or neck pain. No trauma. No n/v/d, no change w/ urination. He has some left sided lower chest wall discomfort over similar duration, improved w/ rest and unchanged w/ exertion. Reproducible w/ palpation to chest wall at times. Not described as pleuritic.   Past Medical History Past Medical History:  Diagnosis Date   Hypertension    There are no problems to display for this patient.  Home Medication(s) Prior to Admission medications   Medication Sig Start Date End Date Taking? Authorizing Provider  acetaminophen (TYLENOL) 325 MG tablet Take 2 tablets (650 mg total) by mouth every 6 (six) hours as needed. 02/10/23  Yes Tanda Rockers A, DO  cyclobenzaprine (FLEXERIL) 10 MG tablet Take 1 tablet (10 mg total) by mouth 2 (two) times daily as needed for muscle spasms. 02/10/23  Yes Tanda Rockers A, DO  ibuprofen (ADVIL) 600 MG tablet Take 1 tablet (600 mg total) by mouth every 6 (six) hours as needed. 02/10/23  Yes Tanda Rockers A, DO  prochlorperazine (COMPAZINE) 10 MG tablet Take 1 tablet (10 mg total) by mouth 2 (two) times daily as needed for nausea or vomiting. 08/23/21   Tegeler, Canary Brim, MD                                                                                                                                     Past Surgical History Past Surgical History:  Procedure Laterality Date   TONSILLECTOMY     Family History History reviewed. No pertinent family history.  Social History Social History   Tobacco Use   Smoking status: Never   Smokeless tobacco: Never  Substance Use Topics   Alcohol use: Never   Drug use: Never   Allergies Penicillins  Review of Systems Review of Systems  Constitutional:  Negative for chills and fever.  HENT:  Negative for facial swelling and trouble swallowing.   Eyes:  Negative for photophobia and visual disturbance.  Respiratory:  Negative for cough and shortness of breath.   Cardiovascular:  Positive for chest pain. Negative for palpitations.  Gastrointestinal:  Negative for abdominal pain, nausea and vomiting.  Endocrine: Negative for polydipsia and polyuria.  Genitourinary:  Negative for difficulty urinating and hematuria.  Musculoskeletal:  Negative for gait problem and joint  swelling.  Skin:  Negative for pallor and rash.  Neurological:  Negative for syncope, weakness, numbness and headaches.       Tingling  Psychiatric/Behavioral:  Negative for agitation and confusion.     Physical Exam Vital Signs  I have reviewed the triage vital signs BP 123/85   Pulse 65   Temp 99.2 F (37.3 C) (Oral)   Resp (!) 26   Ht 5\' 10"  (1.778 m)   Wt 127 kg   SpO2 100%   BMI 40.18 kg/m  Physical Exam Vitals and nursing note reviewed.  Constitutional:      General: He is not in acute distress.    Appearance: He is well-developed. He is obese.  HENT:     Head: Normocephalic and atraumatic.     Right Ear: External ear normal.     Left Ear: External ear normal.     Mouth/Throat:     Mouth: Mucous membranes are moist.  Eyes:     General: No scleral icterus.    Extraocular Movements: Extraocular movements intact.     Pupils: Pupils are equal, round, and reactive to light.  Cardiovascular:     Rate and Rhythm: Normal rate and regular rhythm.      Pulses:          Radial pulses are 2+ on the right side and 2+ on the left side.       Dorsalis pedis pulses are 2+ on the right side and 2+ on the left side.       Posterior tibial pulses are 2+ on the right side and 2+ on the left side.     Heart sounds: Normal heart sounds, S1 normal and S2 normal.  Pulmonary:     Effort: Pulmonary effort is normal. No respiratory distress.     Breath sounds: Normal breath sounds.  Abdominal:     General: Abdomen is flat.     Palpations: Abdomen is soft.     Tenderness: There is no abdominal tenderness. There is no guarding or rebound.  Musculoskeletal:        General: Normal range of motion.     Cervical back: No rigidity.     Right lower leg: No edema.     Left lower leg: No edema.  Skin:    General: Skin is warm and dry.     Capillary Refill: Capillary refill takes less than 2 seconds.  Neurological:     Mental Status: He is alert and oriented to person, place, and time.     GCS: GCS eye subscore is 4. GCS verbal subscore is 5. GCS motor subscore is 6.     Cranial Nerves: Cranial nerves 2-12 are intact. No dysarthria or facial asymmetry.     Sensory: Sensation is intact.     Motor: Motor function is intact. No weakness or tremor.     Coordination: Coordination is intact.     Gait: Gait is intact.     Comments: Strength 5/5 BLUE BLLE BLUE BLLE NVI  Psychiatric:        Mood and Affect: Mood normal.        Behavior: Behavior normal.     ED Results and Treatments Labs (all labs ordered are listed, but only abnormal results are displayed) Labs Reviewed  COMPREHENSIVE METABOLIC PANEL - Abnormal; Notable for the following components:      Result Value   Glucose, Bld 131 (*)    Calcium 8.8 (*)    All other components  within normal limits  CBC WITH DIFFERENTIAL/PLATELET  MAGNESIUM  RAPID URINE DRUG SCREEN, HOSP PERFORMED  TROPONIN I (HIGH SENSITIVITY)  TROPONIN I (HIGH SENSITIVITY)                                                                                                                           Radiology DG Chest Port 1 View  Result Date: 02/10/2023 CLINICAL DATA:  cp EXAM: PORTABLE CHEST 1 VIEW COMPARISON:  None Available. FINDINGS: The heart size and mediastinal contours are within normal limits. Both lungs are clear. No visible pleural effusions or pneumothorax. No acute osseous abnormality. IMPRESSION: No active disease. Electronically Signed   By: Feliberto Harts M.D.   On: 02/10/2023 08:39    Pertinent labs & imaging results that were available during my care of the patient were reviewed by me and considered in my medical decision making (see MDM for details).  Medications Ordered in ED Medications  sodium chloride 0.9 % bolus 1,000 mL (0 mLs Intravenous Stopped 02/10/23 1151)                                                                                                                                     Procedures Procedures  (including critical care time)  Medical Decision Making / ED Course    Medical Decision Making:    Jumar Greenstreet is a 27 y.o. male with past medical history as below, significant for hypertension, obesity who presents to the ED with complaint of cp/arm tingling.. The complaint involves an extensive differential diagnosis and also carries with it a high risk of complications and morbidity.  Serious etiology was considered. Ddx includes but is not limited to: Differential includes all life-threatening causes for chest pain. This includes but is not exclusive to acute coronary syndrome, aortic dissection, pulmonary embolism, cardiac tamponade, community-acquired pneumonia, pericarditis, musculoskeletal chest wall pain, electrolyte derangement, msk, etc.   Complete initial physical exam performed, notably the patient  was NAD, no hypoxia, neuro non focal.    Reviewed and confirmed nursing documentation for past medical history, family history, social history.  Vital signs reviewed.       The patient's chest pain is not suggestive of pulmonary embolus, cardiac ischemia, aortic dissection, pericarditis, myocarditis, pulmonary embolism, pneumothorax, pneumonia, Zoster, or esophageal perforation, or other serious etiology.  Historically not abrupt in onset, tearing or ripping, pulses symmetric. EKG nonspecific for  ischemia/infarction. No dysrhythmias, brugada, WPW, prolonged QT noted.    Troponin negative x2. CXR reviewed. Labs without demonstration of acute pathology unless otherwise noted above. Low HEART Score: 0-3 points (0.9-1.7% risk of MACE).  Symptoms have resolved, He is HDS. Tolerating PO  If tingling sensation continues advised him to follow-up with neurology for EMG, f/u pcp, neuro exam remains non-focal  Given the extremely low risk of these diagnoses further testing and evaluation for these possibilities does not appear to be indicated at this time. Patient in no distress and overall condition improved here in the ED. Detailed discussions were had with the patient regarding current findings, and need for close f/u with PCP or on call doctor. The patient has been instructed to return immediately if the symptoms worsen in any way for re-evaluation. Patient verbalized understanding and is in agreement with current care plan. All questions answered prior to discharge.           Additional history obtained: -Additional history obtained from na -External records from outside source obtained and reviewed including: Chart review including previous notes, labs, imaging, consultation notes including prior ed visits, home meds, prior labs/imaging   Lab Tests: -I ordered, reviewed, and interpreted labs.   The pertinent results include:   Labs Reviewed  COMPREHENSIVE METABOLIC PANEL - Abnormal; Notable for the following components:      Result Value   Glucose, Bld 131 (*)    Calcium 8.8 (*)    All other components within normal limits  CBC WITH  DIFFERENTIAL/PLATELET  MAGNESIUM  RAPID URINE DRUG SCREEN, HOSP PERFORMED  TROPONIN I (HIGH SENSITIVITY)  TROPONIN I (HIGH SENSITIVITY)    Notable for trop neg x2  EKG   EKG Interpretation  Date/Time:  Friday February 10 2023 08:16:31 EDT Ventricular Rate:  100 PR Interval:  146 QRS Duration: 81 QT Interval:  323 QTC Calculation: 417 R Axis:   50 Text Interpretation: Sinus tachycardia Nonspecific T abnormalities, diffuse leads no stemi Confirmed by Tanda Rockers ([id number]) on 02/10/2023 8:17:52 AM         Imaging Studies ordered: I ordered imaging studies including cxr I independently visualized the following imaging with scope of interpretation limited to determining acute life threatening conditions related to emergency care; findings noted above, significant for stable imaging I independently visualized and interpreted imaging. I agree with the radiologist interpretation   Medicines ordered and prescription drug management: Meds ordered this encounter  Medications   sodium chloride 0.9 % bolus 1,000 mL   cyclobenzaprine (FLEXERIL) 10 MG tablet    Sig: Take 1 tablet (10 mg total) by mouth 2 (two) times daily as needed for muscle spasms.    Dispense:  10 tablet    Refill:  0   ibuprofen (ADVIL) 600 MG tablet    Sig: Take 1 tablet (600 mg total) by mouth every 6 (six) hours as needed.    Dispense:  30 tablet    Refill:  0   acetaminophen (TYLENOL) 325 MG tablet    Sig: Take 2 tablets (650 mg total) by mouth every 6 (six) hours as needed.    Dispense:  36 tablet    Refill:  0    -I have reviewed the patients home medicines and have made adjustments as needed   Consultations Obtained: na   Cardiac Monitoring: The patient was maintained on a cardiac monitor.  I personally viewed and interpreted the cardiac monitored which showed an underlying rhythm of: NSR  Social Determinants of Health:  Diagnosis or treatment significantly limited by social determinants of health:  obesity   Reevaluation: After the interventions noted above, I reevaluated the patient and found that they have improved  Co morbidities that complicate the patient evaluation  Past Medical History:  Diagnosis Date   Hypertension       Dispostion: Disposition decision including need for hospitalization was considered, and patient discharged from emergency department.    Final Clinical Impression(s) / ED Diagnoses Final diagnoses:  Atypical chest pain     This chart was dictated using voice recognition software.  Despite best efforts to proofread,  errors can occur which can change the documentation meaning.    Sloan Leiter, DO 02/10/23 1218

## 2023-02-10 NOTE — ED Triage Notes (Signed)
Pt from home drove himself states he has had  left chet pain  tingling in rt RM AND LEG  has been to walk fine denies cough, denies injury  to chest  denies sob or n/v

## 2023-02-10 NOTE — Discharge Instructions (Addendum)
It was a pleasure caring for you today in the emergency department.  If the tingling sensation to your leg continues you may benefit from neurology evaluation and EMG, please follow up with your PCP in regards to this  Please return to the emergency department for any worsening or worrisome symptoms.

## 2023-05-12 IMAGING — CT CT HEAD W/O CM
3 series · 16 of 47 positions shown, 19 images · non-contrast
Comparison: No comparison studies available.

CLINICAL DATA: Pressure in head.  Hand and feet tingling.



[Series 2: head wo · axial · 0.43mm/px · z∈[-160,-25]mm · 10 of 33 slices shown, 13 images]
[im 3/33  brain]
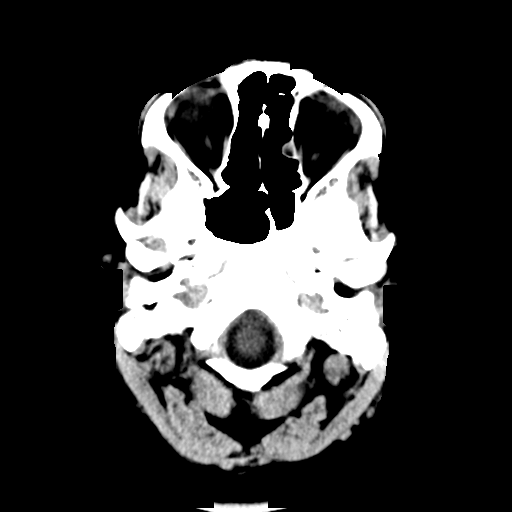
[im 3/33  bone]
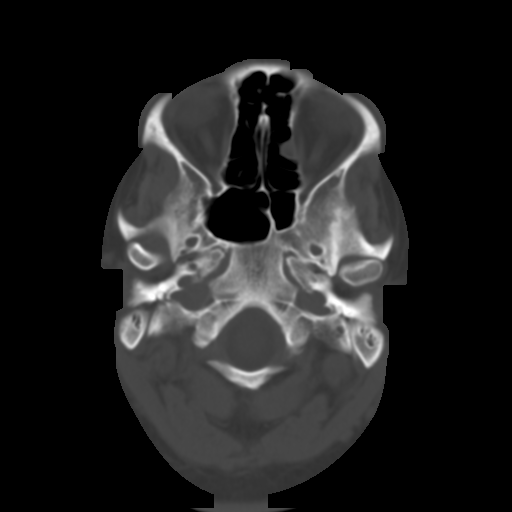
[im 6/33  brain]
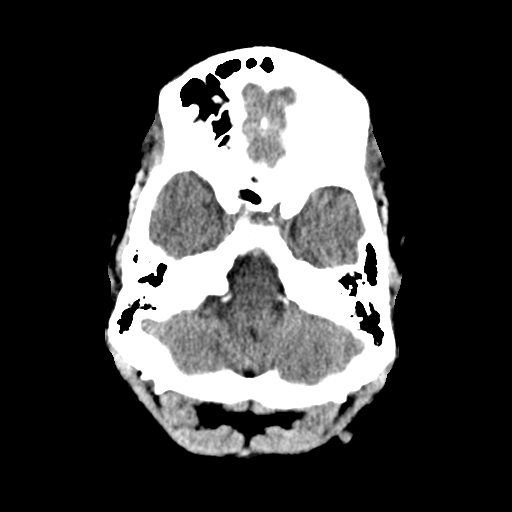
[im 9/33  brain]
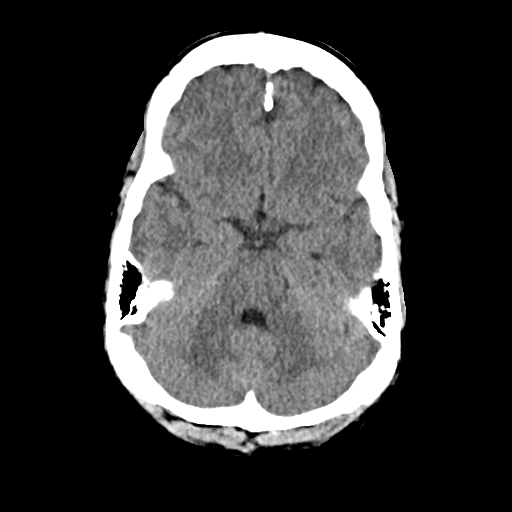
[im 12/33  brain]
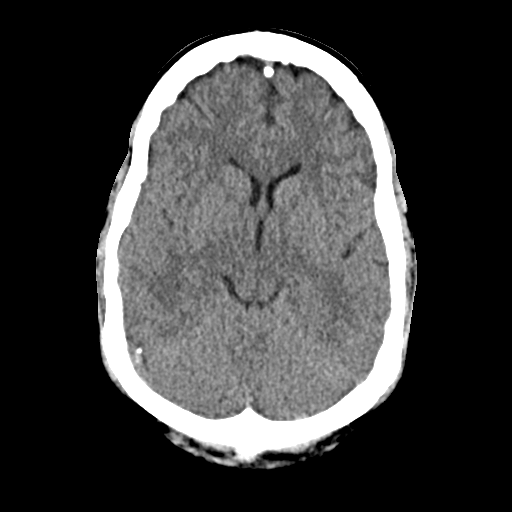
[im 15/33  brain]
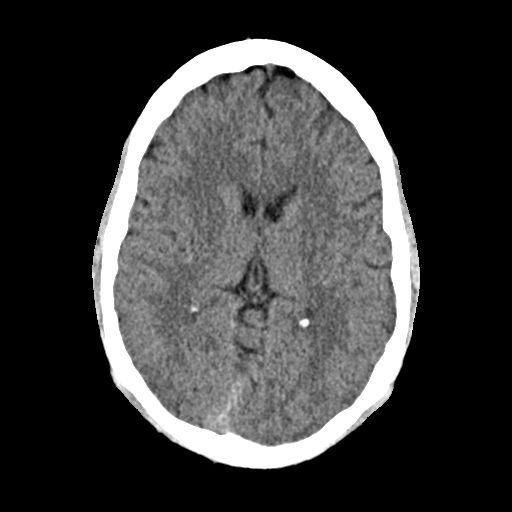
[im 15/33  bone]
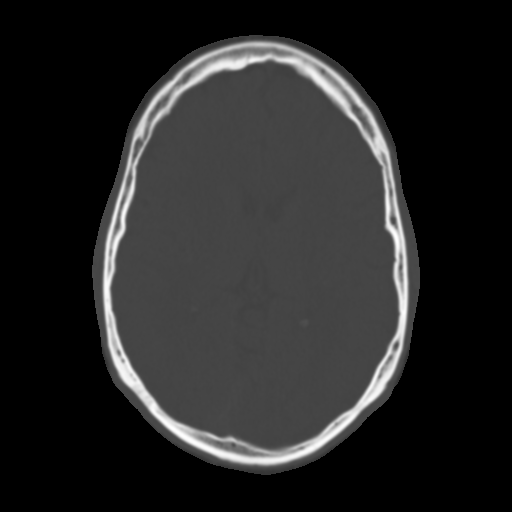
[im 18/33  brain]
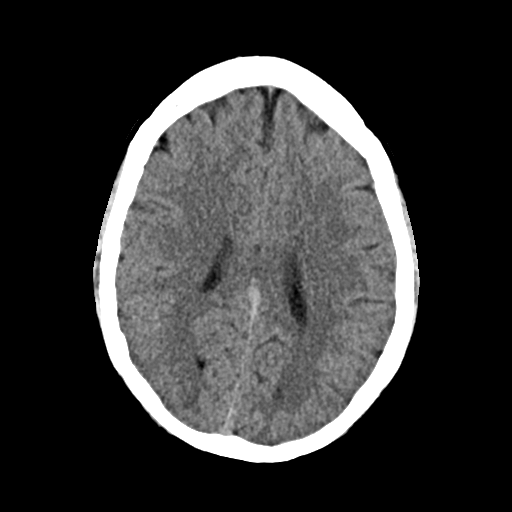
[im 21/33  brain]
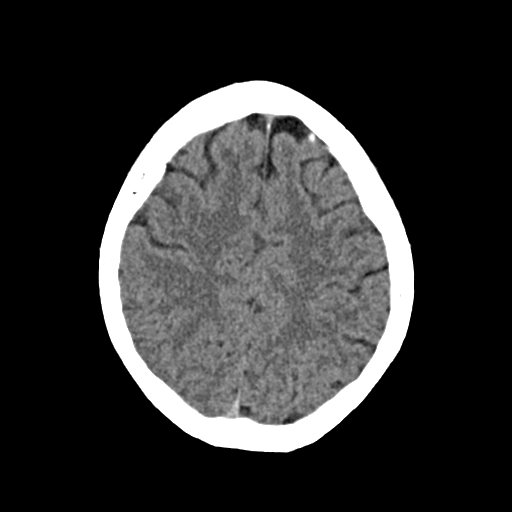
[im 25/33  brain]
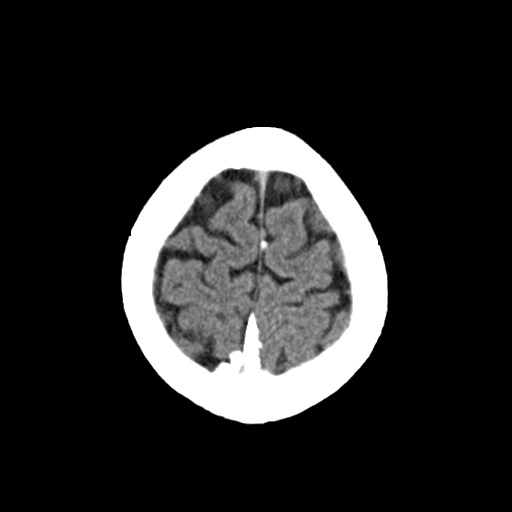
[im 27/33  brain]
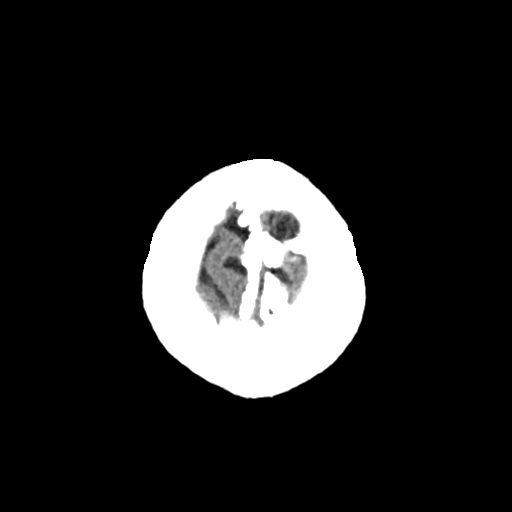
[im 27/33  bone]
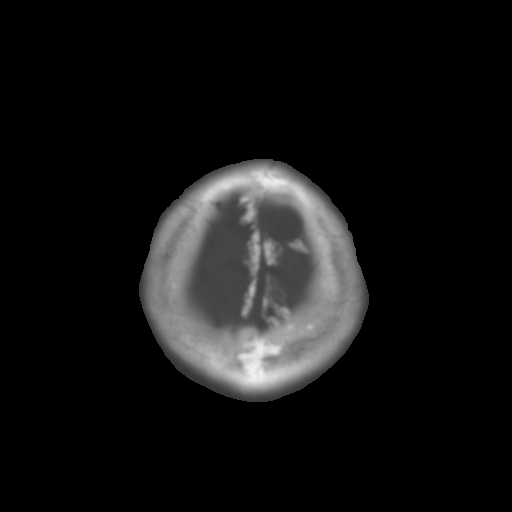
[im 30/33  brain]
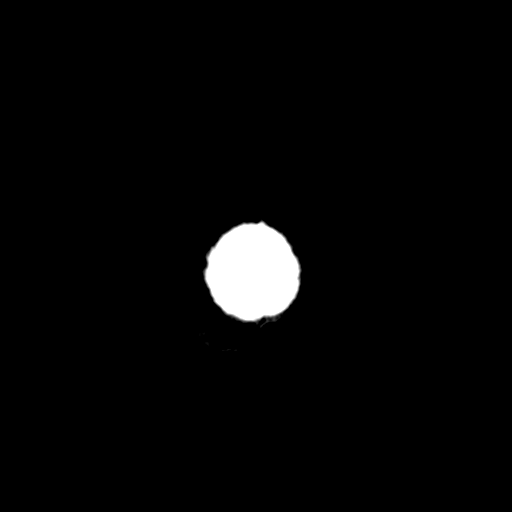

[Series 4: coronal soft · coronal · 0.34mm/px · 3 of 71 slices shown]
[im 24/71  brain]
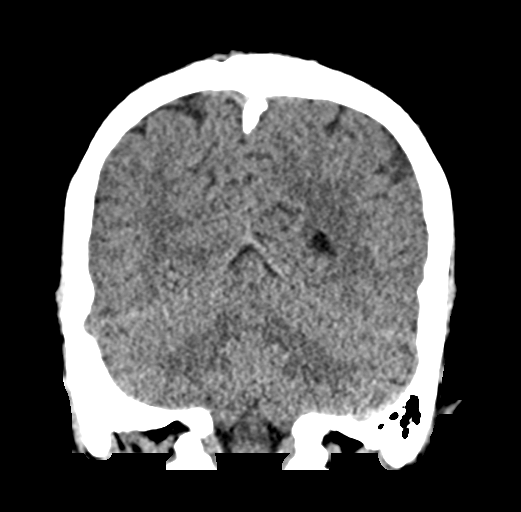
[im 32/71  brain]
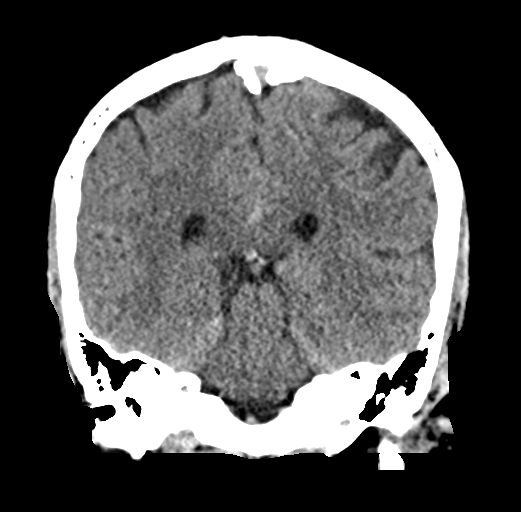
[im 39/71  brain]
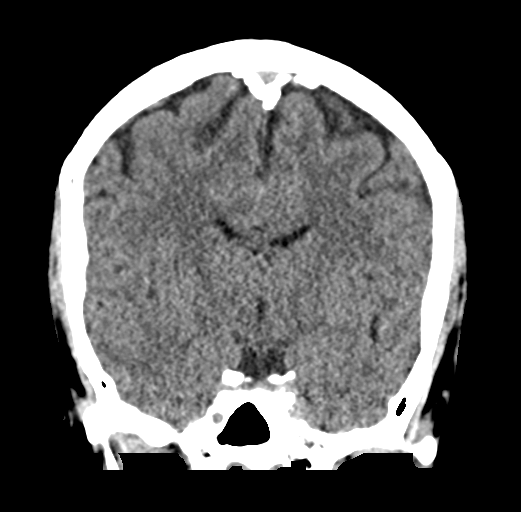

[Series 5: sag soft · sagittal · 0.34mm/px · 3 of 54 slices shown]
[im 18/54  brain]
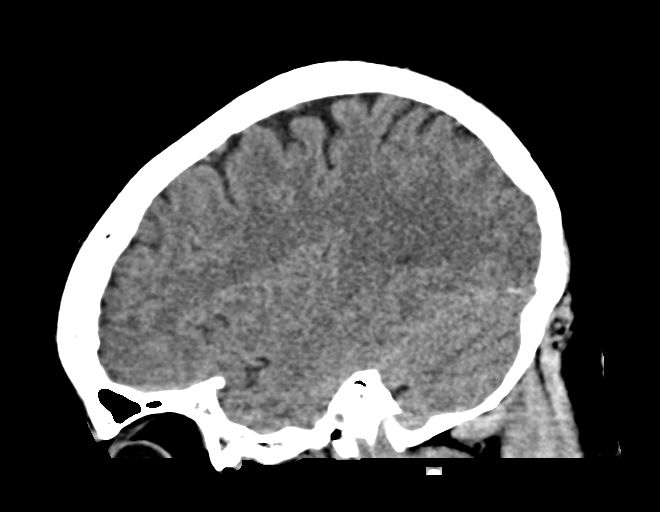
[im 27/54  brain]
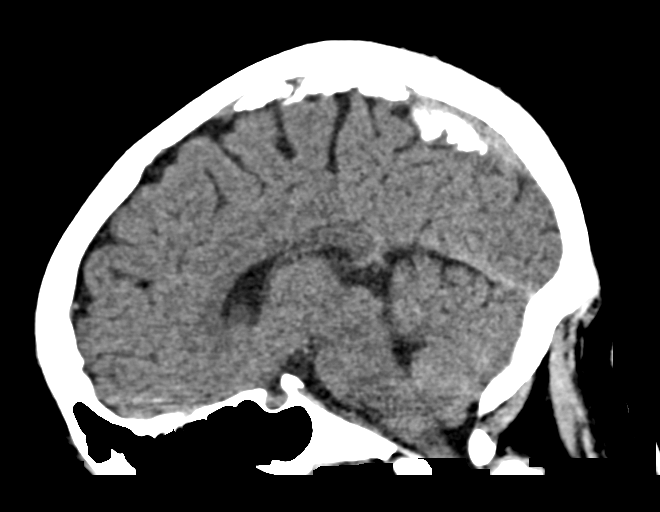
[im 36/54  brain]
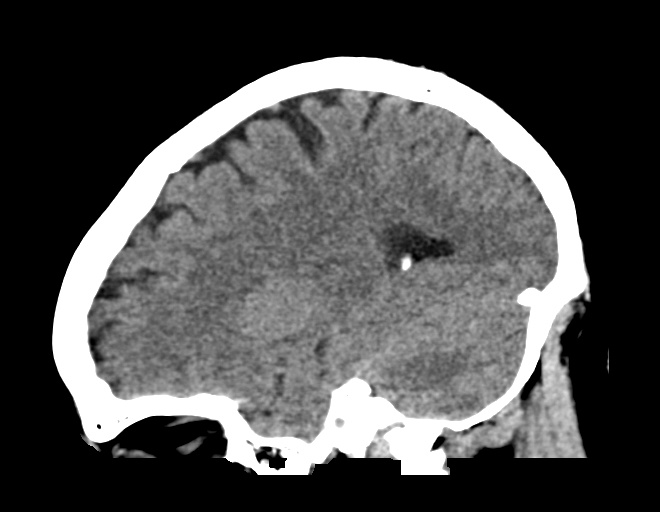

[16 of 47 positions shown; findings below may reference images not displayed]

FINDINGS: Brain: There is no evidence for acute hemorrhage, hydrocephalus,
mass lesion, or abnormal extra-axial fluid collection. No definite
CT evidence for acute infarction.

Vascular: No hyperdense vessel or unexpected calcification.

Skull: No evidence for fracture. No worrisome lytic or sclerotic
lesion.

Sinuses/Orbits: The visualized paranasal sinuses and mastoid air
cells are clear. Visualized portions of the globes and intraorbital
fat are unremarkable.

Other: None.
IMPRESSION: No acute intracranial abnormality.
